# Patient Record
Sex: Female | Born: 2005 | Race: White | Hispanic: No | Marital: Single | Smoking: Never smoker
Health system: Southern US, Community
[De-identification: ages and names within clinical notes are randomized; demographics above are authoritative.]

## PROBLEM LIST (undated history)

## (undated) HISTORY — PX: TONSILLECTOMY: SUR1361

---
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
## 2023-01-05 DIAGNOSIS — F411 Generalized anxiety disorder: Secondary | ICD-10-CM | POA: Diagnosis not present

## 2023-01-17 DIAGNOSIS — F411 Generalized anxiety disorder: Secondary | ICD-10-CM | POA: Diagnosis not present

## 2023-02-16 DIAGNOSIS — F411 Generalized anxiety disorder: Secondary | ICD-10-CM | POA: Diagnosis not present

## 2023-03-02 DIAGNOSIS — F411 Generalized anxiety disorder: Secondary | ICD-10-CM | POA: Diagnosis not present

## 2023-03-09 DIAGNOSIS — F411 Generalized anxiety disorder: Secondary | ICD-10-CM | POA: Diagnosis not present

## 2023-03-15 ENCOUNTER — Other Ambulatory Visit: Payer: Self-pay | Admitting: Child and Adolescent Psychiatry

## 2023-03-15 DIAGNOSIS — F411 Generalized anxiety disorder: Secondary | ICD-10-CM

## 2023-03-21 ENCOUNTER — Other Ambulatory Visit (HOSPITAL_COMMUNITY): Payer: Self-pay

## 2023-03-22 DIAGNOSIS — Z130 Encounter for screening for diseases of the blood and blood-forming organs and certain disorders involving the immune mechanism: Secondary | ICD-10-CM | POA: Diagnosis not present

## 2023-03-22 DIAGNOSIS — Z1389 Encounter for screening for other disorder: Secondary | ICD-10-CM | POA: Diagnosis not present

## 2023-03-22 DIAGNOSIS — Z01419 Encounter for gynecological examination (general) (routine) without abnormal findings: Secondary | ICD-10-CM | POA: Diagnosis not present

## 2023-03-22 DIAGNOSIS — Z113 Encounter for screening for infections with a predominantly sexual mode of transmission: Secondary | ICD-10-CM | POA: Diagnosis not present

## 2023-03-27 ENCOUNTER — Other Ambulatory Visit (HOSPITAL_COMMUNITY): Payer: Self-pay

## 2023-04-05 DIAGNOSIS — F411 Generalized anxiety disorder: Secondary | ICD-10-CM | POA: Diagnosis not present

## 2023-05-09 DIAGNOSIS — F411 Generalized anxiety disorder: Secondary | ICD-10-CM | POA: Diagnosis not present

## 2023-05-29 DIAGNOSIS — F411 Generalized anxiety disorder: Secondary | ICD-10-CM | POA: Diagnosis not present

## 2023-06-06 ENCOUNTER — Other Ambulatory Visit (HOSPITAL_COMMUNITY): Payer: Self-pay

## 2023-06-06 ENCOUNTER — Encounter: Payer: Self-pay | Admitting: Child and Adolescent Psychiatry

## 2023-06-06 ENCOUNTER — Ambulatory Visit (INDEPENDENT_AMBULATORY_CARE_PROVIDER_SITE_OTHER): Payer: Commercial Managed Care - PPO | Admitting: Child and Adolescent Psychiatry

## 2023-06-06 VITALS — BP 118/77 | HR 103 | Temp 98.6°F | Ht 65.0 in | Wt 136.2 lb

## 2023-06-06 DIAGNOSIS — F411 Generalized anxiety disorder: Secondary | ICD-10-CM

## 2023-06-06 DIAGNOSIS — F39 Unspecified mood [affective] disorder: Secondary | ICD-10-CM | POA: Diagnosis not present

## 2023-06-06 DIAGNOSIS — F902 Attention-deficit hyperactivity disorder, combined type: Secondary | ICD-10-CM | POA: Diagnosis not present

## 2023-06-06 MED ORDER — AMPHETAMINE-DEXTROAMPHET ER 10 MG PO CP24
10.0000 mg | ORAL_CAPSULE | ORAL | 0 refills | Status: AC
  Filled 2023-06-06: qty 30, 30d supply, fill #0

## 2023-06-06 NOTE — Progress Notes (Signed)
BH MD/PA/NP OP Progress Note  06/06/2023 11:53 AM Heather Lawson  MRN:  [id number]  Chief Complaint:  Chief Complaint   Follow-up   Medication management follow-up for mood, anxiety, ADHD.  HPI:  This is an 17 -year-old female,  HS graduate and rising freshman at [employer], previously domiciled in between three households(Biological mother and brother alternating every week with step father/step mother and siblings and spends every other weekend with father and step mother who live in [place]), and now between her aunt(most days) and step father on weekends, and no significant medical history, and psychiatric history significant of Depression, Anxiety, ADHD. She presents today for follow up after about 7 months to re-establish med management for ADHD.   She was present by herself and was evaluated alone.  She reports that she made this appointment to reestablish medication management for her ADHD.  She says that in regards of her overall mental health she has been doing well, has not been having any ups or downs with her mood and says that her mood has been stable overall.  She also says that her anxiety has been much less and she has been able to manage it well.  She however reports that she has been working and will be starting college soon, and has noticed that she is not able to focus as well and finish the tasks at hand.  She says that previously Adderall was helpful with this and therefore she would like to go back on the medication.  Additionally in regards of her mood, she says that she has been feeling more motivated, very hopeful about her future, not having any problems with sleep or appetite and this is despite not taking any of her medications.  She says that she continues to see her therapist about every 2 weeks.  She plans to establish outpatient psychotherapy in [place] once she moves.  She says that she is moving to [place] because her  aunt is moving there and she got into the college there.  She says that she prefers to do [employer] to save money and then transferred to [employer].  She denies any SI, nonsuicidal self-harm behaviors or thoughts, denies any HI.  She says that she gets along very well with her aunt, continues to have a good relationship with her stepfather, sees her mother about once or twice a month, and does not like to see her dad as much.  She denies any major psychosocial stressors recently.  We discussed that we can start her back on Adderall XR 10 mg daily, discussed risks and benefits, side effects including but not limited to appetite suppression which was a problem before.  She verbalized understanding and provided verbal informed consent.  We discussed that Clinical research associate cannot practicing tendency and therefore for her to continue to receive treatment at this clinic she will have to present physically in [place].  She verbalized understanding and also says that she will also look for a psychiatrist in [place].  She will follow-up next month before moving to [place].  Visit Diagnosis:    ICD-10-CM   1. Attention deficit hyperactivity disorder (ADHD), combined type  F90.2 amphetamine-dextroamphetamine (ADDERALL XR) 10 MG 24 hr capsule    2. Generalized anxiety disorder  F41.1     3. Mood disorder (HCC)  F39          Past Psychiatric History:     No previous inpatient psychiatric hospitalizations. Medication trials include Prozac  which patient reports that it caused her being.  She tried Prozac up to 40 mg once a day. No other medication trials and has been taking Zoloft 50 mg since last 6 months and Vyvanse 20 mg since about last 1 year. Has been seeing Ms. Normajean Glasgow at [facility] since 2020 and sees about once every other week or more if needed. Patient denies any history of violence however reports that she had attempted to overdose in early 2020 by overdosing on ibuprofen  and sleeping pills.  She reports that at that time she did not disclose this to anyone and fell asleep.  She denies any other suicide attempts.  He does report history of intermittent nonsuicidal self-harm behaviors(cutting)  Past Medical History: History reviewed. No pertinent past medical history.  Past Surgical History:  Procedure Laterality Date   TONSILLECTOMY      Family Psychiatric History:   Mother with depression and anxiety Father with ADHD and alcohol abuse Paternal aunt with depression, anxiety and ADHD Maternal aunt with OCD, anxiety, depression, bipolar disorder No history of suicide in the family.  Family History: History reviewed. No pertinent family history.  Social History:  Social History   Socioeconomic History   Marital status: Single    Spouse name: Not on file   Number of children: Not on file   Years of education: Not on file   Highest education level: Not on file  Occupational History   Not on file  Tobacco Use   Smoking status: Never   Smokeless tobacco: Not on file  Substance and Sexual Activity   Alcohol use: No   Drug use: Never   Sexual activity: Not Currently  Other Topics Concern   Not on file  Social History Narrative   ** Merged History Encounter **       Social Determinants of Health   Financial Resource Strain: Not on file  Food Insecurity: Not on file  Transportation Needs: Not on file  Physical Activity: Not on file  Stress: Not on file  Social Connections: Not on file    Allergies:  Allergies  Allergen Reactions   Amoxicillin Rash    Metabolic Disorder Labs: No results found for: "HGBA1C", "MPG" No results found for: "PROLACTIN" No results found for: "CHOL", "TRIG", "HDL", "CHOLHDL", "VLDL", "LDLCALC" No results found for: "TSH"  Therapeutic Level Labs: No results found for: "LITHIUM" No results found for: "VALPROATE" No results found for: "CBMZ"  Current Medications: Current Outpatient Medications   Medication Sig Dispense Refill   etonogestrel (NEXPLANON) 68 MG IMPL implant Device [facility]     amphetamine-dextroamphetamine (ADDERALL XR) 10 MG 24 hr capsule Take 1 capsule (10 mg total) by mouth every morning. 30 capsule 0   Pediatric Multiple Vit-C-FA (PEDIATRIC MULTIVITAMIN) chewable tablet Chew 1 tablet by mouth daily. (Patient not taking: Reported on 06/06/2023)     No current facility-administered medications for this visit.     Musculoskeletal: Strength & Muscle Tone: unable to assess since visit was over the telemedicine.  Gait & Station: unable to assess since visit was over the telemedicine.  Patient leans: N/A  Psychiatric Specialty Exam: Review of Systems  Blood pressure 118/77, pulse (!) 103, temperature 98.6 F (37 C), temperature source Skin, height 5\' 5"  (1.651 m), weight 136 lb 3.2 oz (61.8 kg).Body mass index is 22.66 kg/m.  General Appearance: Casual and Well Groomed  Eye Contact:  Good  Speech:  Clear and Coherent and Normal Rate  Volume:  Normal  Mood:   "good"  Affect:  Appropriate, Congruent, and Full Range  Thought Process:  Goal Directed and Linear  Orientation:  Full (Time, Place, and Person)  Thought Content: Logical   Suicidal Thoughts:  No  Homicidal Thoughts:  No  Memory:  Immediate;   Fair Recent;   Fair Remote;   Fair  Judgement:  Fair  Insight:  Fair  Psychomotor Activity:  Normal  Concentration:  Concentration: Fair and Attention Span: Fair  Recall:  [employer] of Knowledge: Fair  Language: Fair  Akathisia:  No    AIMS (if indicated): not done  Assets:  Communication Skills Desire for Improvement Financial Resources/Insurance Housing Leisure Time Physical Health Social Support Transportation Vocational/Educational  ADL's:  Intact  Cognition: WNL  Sleep:  Fair   Screenings: Flowsheet Row ED from 06/21/2022 in [facility] Emergency Department at [facility] ED from 10/27/2021 in [facility] Emergency  Department at [facility]  C-SSRS RISK CATEGORY No Risk No Risk        Assessment and Plan:   17 year old [place] female with prior psychiatric history of MDD, GAD, ADHD. She has strong genetic predisposition to psychiatric conditions (ADHD, MDD, Anxiety, Bipolar Disorder, Alcohol abuse) and complex family dynamics. Her and mother's report appeared most consistent with bipolar 2 disorder on initial evaluation. She did not notice improvement on Zoloft alone, therefore recommended to start Lamictal due to lack of metabolic side effects as compare to abilify/risperdal/depakote while continuing Zoloft and Vyvanse. She responded well to this but Vyvanse was changed to Adderall due to appetite suppression. She now presents after about 7 months and appears to have not been taking any meds since last 6 months. Appears to have remission in depressive symptoms and stability with anxiety per her report, but reports that she struggles with inattention problems impacting her work and school. Will restart Adderall XR at 10 mg daily and continue to monitor. She will come back for follow up in 1 month to reassess her progress.      Plan:   # Mood (better) - Self discontinued Lamictal 25 mg in AM .  - Self discontinue Zoloft 100 mg daily - Continue ind therapy with Ms. Lauren at [facility], every other week.    # Anxiety (chronic and stable) - As mentioned above.    # ADHD (chronic, worse) - Restart Adderall XR 10 mg daily.    # Sleep(improved) - Self discontinued Trazodone 50 mg QHS PRN for sleep  This note was generated in part or whole with voice recognition software. Voice recognition is usually quite accurate but there are transcription errors that can and very often do occur. I apologize for any typographical errors that were not detected and corrected.      Darcel Smalling, MD 06/06/2023, 11:53 AM

## 2023-06-07 ENCOUNTER — Other Ambulatory Visit (HOSPITAL_COMMUNITY): Payer: Self-pay

## 2023-06-13 DIAGNOSIS — F411 Generalized anxiety disorder: Secondary | ICD-10-CM | POA: Diagnosis not present

## 2023-06-26 DIAGNOSIS — F411 Generalized anxiety disorder: Secondary | ICD-10-CM | POA: Diagnosis not present

## 2023-07-03 ENCOUNTER — Telehealth: Payer: Self-pay | Admitting: Child and Adolescent Psychiatry

## 2023-07-03 ENCOUNTER — Ambulatory Visit: Payer: Commercial Managed Care - PPO | Admitting: Child and Adolescent Psychiatry

## 2023-07-03 NOTE — Telephone Encounter (Signed)
Called pt to check if she is coming for her appointment as she did not show up on time for her appointment. No answer, left VM to call back to reschedule if they are not able to attend today's appointment.

## 2023-07-06 ENCOUNTER — Ambulatory Visit: Payer: Commercial Managed Care - PPO | Admitting: Child and Adolescent Psychiatry

## 2023-07-10 DIAGNOSIS — F411 Generalized anxiety disorder: Secondary | ICD-10-CM | POA: Diagnosis not present

## 2023-07-26 DIAGNOSIS — F411 Generalized anxiety disorder: Secondary | ICD-10-CM | POA: Diagnosis not present

## 2023-10-05 DIAGNOSIS — S93401A Sprain of unspecified ligament of right ankle, initial encounter: Secondary | ICD-10-CM | POA: Diagnosis not present

## 2023-10-05 DIAGNOSIS — R300 Dysuria: Secondary | ICD-10-CM | POA: Diagnosis not present

## 2023-10-05 DIAGNOSIS — S99911A Unspecified injury of right ankle, initial encounter: Secondary | ICD-10-CM | POA: Diagnosis not present

## 2023-10-05 DIAGNOSIS — M7989 Other specified soft tissue disorders: Secondary | ICD-10-CM | POA: Diagnosis not present

## 2023-10-05 DIAGNOSIS — N3000 Acute cystitis without hematuria: Secondary | ICD-10-CM | POA: Diagnosis not present

## 2023-10-05 DIAGNOSIS — M25571 Pain in right ankle and joints of right foot: Secondary | ICD-10-CM | POA: Diagnosis not present

## 2023-11-11 DIAGNOSIS — F902 Attention-deficit hyperactivity disorder, combined type: Secondary | ICD-10-CM | POA: Diagnosis not present

## 2023-11-11 DIAGNOSIS — F39 Unspecified mood [affective] disorder: Secondary | ICD-10-CM | POA: Diagnosis not present

## 2023-11-11 DIAGNOSIS — F331 Major depressive disorder, recurrent, moderate: Secondary | ICD-10-CM | POA: Diagnosis not present

## 2023-11-11 DIAGNOSIS — F411 Generalized anxiety disorder: Secondary | ICD-10-CM | POA: Diagnosis not present

## 2023-11-16 DIAGNOSIS — Z113 Encounter for screening for infections with a predominantly sexual mode of transmission: Secondary | ICD-10-CM | POA: Diagnosis not present

## 2023-11-17 DIAGNOSIS — F902 Attention-deficit hyperactivity disorder, combined type: Secondary | ICD-10-CM | POA: Diagnosis not present

## 2023-11-17 DIAGNOSIS — F411 Generalized anxiety disorder: Secondary | ICD-10-CM | POA: Diagnosis not present

## 2023-11-17 DIAGNOSIS — F39 Unspecified mood [affective] disorder: Secondary | ICD-10-CM | POA: Diagnosis not present

## 2023-11-17 DIAGNOSIS — F331 Major depressive disorder, recurrent, moderate: Secondary | ICD-10-CM | POA: Diagnosis not present

## 2023-11-22 DIAGNOSIS — Z87891 Personal history of nicotine dependence: Secondary | ICD-10-CM | POA: Diagnosis not present

## 2023-11-22 DIAGNOSIS — N941 Unspecified dyspareunia: Secondary | ICD-10-CM | POA: Diagnosis not present

## 2023-11-22 DIAGNOSIS — R102 Pelvic and perineal pain: Secondary | ICD-10-CM | POA: Diagnosis not present

## 2023-11-22 DIAGNOSIS — Z68.41 Body mass index (BMI) pediatric, 5th percentile to less than 85th percentile for age: Secondary | ICD-10-CM | POA: Diagnosis not present

## 2023-11-24 DIAGNOSIS — F331 Major depressive disorder, recurrent, moderate: Secondary | ICD-10-CM | POA: Diagnosis not present

## 2023-11-24 DIAGNOSIS — F902 Attention-deficit hyperactivity disorder, combined type: Secondary | ICD-10-CM | POA: Diagnosis not present

## 2023-11-24 DIAGNOSIS — F39 Unspecified mood [affective] disorder: Secondary | ICD-10-CM | POA: Diagnosis not present

## 2023-11-24 DIAGNOSIS — F411 Generalized anxiety disorder: Secondary | ICD-10-CM | POA: Diagnosis not present

## 2023-12-05 DIAGNOSIS — F902 Attention-deficit hyperactivity disorder, combined type: Secondary | ICD-10-CM | POA: Diagnosis not present

## 2023-12-05 DIAGNOSIS — F331 Major depressive disorder, recurrent, moderate: Secondary | ICD-10-CM | POA: Diagnosis not present

## 2023-12-05 DIAGNOSIS — F411 Generalized anxiety disorder: Secondary | ICD-10-CM | POA: Diagnosis not present

## 2023-12-05 DIAGNOSIS — F39 Unspecified mood [affective] disorder: Secondary | ICD-10-CM | POA: Diagnosis not present

## 2023-12-18 DIAGNOSIS — F331 Major depressive disorder, recurrent, moderate: Secondary | ICD-10-CM | POA: Diagnosis not present

## 2023-12-18 DIAGNOSIS — F39 Unspecified mood [affective] disorder: Secondary | ICD-10-CM | POA: Diagnosis not present

## 2023-12-18 DIAGNOSIS — F902 Attention-deficit hyperactivity disorder, combined type: Secondary | ICD-10-CM | POA: Diagnosis not present

## 2023-12-18 DIAGNOSIS — F411 Generalized anxiety disorder: Secondary | ICD-10-CM | POA: Diagnosis not present

## 2024-01-01 DIAGNOSIS — F411 Generalized anxiety disorder: Secondary | ICD-10-CM | POA: Diagnosis not present

## 2024-01-01 DIAGNOSIS — F902 Attention-deficit hyperactivity disorder, combined type: Secondary | ICD-10-CM | POA: Diagnosis not present

## 2024-01-01 DIAGNOSIS — F331 Major depressive disorder, recurrent, moderate: Secondary | ICD-10-CM | POA: Diagnosis not present

## 2024-01-01 DIAGNOSIS — F39 Unspecified mood [affective] disorder: Secondary | ICD-10-CM | POA: Diagnosis not present

## 2024-01-26 DIAGNOSIS — F902 Attention-deficit hyperactivity disorder, combined type: Secondary | ICD-10-CM | POA: Diagnosis not present

## 2024-01-26 DIAGNOSIS — F39 Unspecified mood [affective] disorder: Secondary | ICD-10-CM | POA: Diagnosis not present

## 2024-01-26 DIAGNOSIS — F411 Generalized anxiety disorder: Secondary | ICD-10-CM | POA: Diagnosis not present

## 2024-01-26 DIAGNOSIS — F331 Major depressive disorder, recurrent, moderate: Secondary | ICD-10-CM | POA: Diagnosis not present

## 2024-02-23 ENCOUNTER — Telehealth: Admitting: Physician Assistant

## 2024-02-23 DIAGNOSIS — R1030 Lower abdominal pain, unspecified: Secondary | ICD-10-CM

## 2024-02-23 DIAGNOSIS — R300 Dysuria: Secondary | ICD-10-CM

## 2024-02-23 DIAGNOSIS — M545 Low back pain, unspecified: Secondary | ICD-10-CM

## 2024-02-23 DIAGNOSIS — R112 Nausea with vomiting, unspecified: Secondary | ICD-10-CM

## 2024-02-23 DIAGNOSIS — R197 Diarrhea, unspecified: Secondary | ICD-10-CM

## 2024-02-23 NOTE — Progress Notes (Signed)
  Because of having back pain, belly pain, and GI symptoms with UTI symptoms, I feel your condition warrants further evaluation and I recommend that you be seen in a face-to-face visit. These symptoms could indicate a more complicated UTI or possible kidney infection called Pyelonephritis. This should be evaluated in person as it can become severe quickly. Through e-visit questionnaires, we only treat uncomplicated UTIs.    NOTE: There will be NO CHARGE for this E-Visit   If you are having a true medical emergency, please call 911.     For an urgent face to face visit, [place] has multiple urgent care centers for your convenience.  Click the link below for the full list of locations and hours, walk-in wait times, appointment scheduling options and driving directions:  Urgent Care - [place], [place], [place], [place], [place], [place]  [place]     Your MyChart E-visit questionnaire answers were reviewed by a board certified advanced clinical practitioner to complete your personal care plan based on your specific symptoms.    Thank you for using e-Visits.     I have spent 5 minutes in review of e-visit questionnaire, review and updating patient chart, medical decision making and response to patient.   Margaretann Loveless, PA-C

## 2024-03-01 DIAGNOSIS — F411 Generalized anxiety disorder: Secondary | ICD-10-CM | POA: Diagnosis not present

## 2024-03-01 DIAGNOSIS — F902 Attention-deficit hyperactivity disorder, combined type: Secondary | ICD-10-CM | POA: Diagnosis not present

## 2024-03-01 DIAGNOSIS — F331 Major depressive disorder, recurrent, moderate: Secondary | ICD-10-CM | POA: Diagnosis not present

## 2024-03-01 DIAGNOSIS — F39 Unspecified mood [affective] disorder: Secondary | ICD-10-CM | POA: Diagnosis not present

## 2024-03-06 DIAGNOSIS — F411 Generalized anxiety disorder: Secondary | ICD-10-CM | POA: Diagnosis not present

## 2024-03-06 DIAGNOSIS — F902 Attention-deficit hyperactivity disorder, combined type: Secondary | ICD-10-CM | POA: Diagnosis not present

## 2024-03-06 DIAGNOSIS — F331 Major depressive disorder, recurrent, moderate: Secondary | ICD-10-CM | POA: Diagnosis not present

## 2024-03-06 DIAGNOSIS — F39 Unspecified mood [affective] disorder: Secondary | ICD-10-CM | POA: Diagnosis not present

## 2024-03-12 DIAGNOSIS — F331 Major depressive disorder, recurrent, moderate: Secondary | ICD-10-CM | POA: Diagnosis not present

## 2024-03-12 DIAGNOSIS — F39 Unspecified mood [affective] disorder: Secondary | ICD-10-CM | POA: Diagnosis not present

## 2024-03-12 DIAGNOSIS — F411 Generalized anxiety disorder: Secondary | ICD-10-CM | POA: Diagnosis not present

## 2024-03-12 DIAGNOSIS — F902 Attention-deficit hyperactivity disorder, combined type: Secondary | ICD-10-CM | POA: Diagnosis not present

## 2024-03-26 DIAGNOSIS — F331 Major depressive disorder, recurrent, moderate: Secondary | ICD-10-CM | POA: Diagnosis not present

## 2024-03-26 DIAGNOSIS — F902 Attention-deficit hyperactivity disorder, combined type: Secondary | ICD-10-CM | POA: Diagnosis not present

## 2024-03-26 DIAGNOSIS — F39 Unspecified mood [affective] disorder: Secondary | ICD-10-CM | POA: Diagnosis not present

## 2024-03-26 DIAGNOSIS — F411 Generalized anxiety disorder: Secondary | ICD-10-CM | POA: Diagnosis not present

## 2024-04-02 DIAGNOSIS — N390 Urinary tract infection, site not specified: Secondary | ICD-10-CM | POA: Diagnosis not present

## 2024-04-03 DIAGNOSIS — F411 Generalized anxiety disorder: Secondary | ICD-10-CM | POA: Diagnosis not present

## 2024-04-03 DIAGNOSIS — F902 Attention-deficit hyperactivity disorder, combined type: Secondary | ICD-10-CM | POA: Diagnosis not present

## 2024-04-03 DIAGNOSIS — F331 Major depressive disorder, recurrent, moderate: Secondary | ICD-10-CM | POA: Diagnosis not present

## 2024-04-03 DIAGNOSIS — F39 Unspecified mood [affective] disorder: Secondary | ICD-10-CM | POA: Diagnosis not present

## 2024-05-06 DIAGNOSIS — Z2821 Immunization not carried out because of patient refusal: Secondary | ICD-10-CM | POA: Diagnosis not present

## 2024-05-06 DIAGNOSIS — Z3046 Encounter for surveillance of implantable subdermal contraceptive: Secondary | ICD-10-CM | POA: Diagnosis not present

## 2024-05-06 DIAGNOSIS — Z87891 Personal history of nicotine dependence: Secondary | ICD-10-CM | POA: Diagnosis not present

## 2024-05-06 DIAGNOSIS — Z68.41 Body mass index (BMI) pediatric, 5th percentile to less than 85th percentile for age: Secondary | ICD-10-CM | POA: Diagnosis not present

## 2024-05-06 DIAGNOSIS — N898 Other specified noninflammatory disorders of vagina: Secondary | ICD-10-CM | POA: Diagnosis not present

## 2024-05-06 DIAGNOSIS — Z3049 Encounter for surveillance of other contraceptives: Secondary | ICD-10-CM | POA: Diagnosis not present

## 2024-05-29 DIAGNOSIS — F902 Attention-deficit hyperactivity disorder, combined type: Secondary | ICD-10-CM | POA: Diagnosis not present

## 2024-05-29 DIAGNOSIS — F39 Unspecified mood [affective] disorder: Secondary | ICD-10-CM | POA: Diagnosis not present

## 2024-05-29 DIAGNOSIS — F411 Generalized anxiety disorder: Secondary | ICD-10-CM | POA: Diagnosis not present

## 2024-05-29 DIAGNOSIS — F331 Major depressive disorder, recurrent, moderate: Secondary | ICD-10-CM | POA: Diagnosis not present

## 2024-06-08 DIAGNOSIS — F331 Major depressive disorder, recurrent, moderate: Secondary | ICD-10-CM | POA: Diagnosis not present

## 2024-06-08 DIAGNOSIS — F902 Attention-deficit hyperactivity disorder, combined type: Secondary | ICD-10-CM | POA: Diagnosis not present

## 2024-06-08 DIAGNOSIS — F411 Generalized anxiety disorder: Secondary | ICD-10-CM | POA: Diagnosis not present

## 2024-06-08 DIAGNOSIS — F39 Unspecified mood [affective] disorder: Secondary | ICD-10-CM | POA: Diagnosis not present

## 2024-06-18 DIAGNOSIS — N390 Urinary tract infection, site not specified: Secondary | ICD-10-CM | POA: Diagnosis not present
